# Patient Record
Sex: Female | Born: 1965 | Race: White | Hispanic: No | Marital: Married | Smoking: Never smoker
Health system: Southern US, Community
[De-identification: ages and names within clinical notes are randomized; demographics above are authoritative.]

## PROBLEM LIST (undated history)

## (undated) DIAGNOSIS — C50919 Malignant neoplasm of unspecified site of unspecified female breast: Secondary | ICD-10-CM

## (undated) HISTORY — PX: ABDOMINAL HYSTERECTOMY: SHX81

## (undated) HISTORY — PX: OTHER SURGICAL HISTORY: SHX169

## (undated) HISTORY — PX: CHOLECYSTECTOMY: SHX55

## (undated) HISTORY — PX: TONSILLECTOMY: SUR1361

## (undated) HISTORY — PX: APPENDECTOMY: SHX54

## (undated) HISTORY — PX: TOTAL THYROIDECTOMY: SHX2547

## (undated) HISTORY — PX: BREAST LUMPECTOMY: SHX2

---

## 2013-07-06 ENCOUNTER — Encounter (HOSPITAL_COMMUNITY): Payer: Self-pay | Admitting: Emergency Medicine

## 2013-07-06 ENCOUNTER — Emergency Department (HOSPITAL_COMMUNITY): Payer: BC Managed Care – PPO

## 2013-07-06 ENCOUNTER — Emergency Department (HOSPITAL_COMMUNITY)
Admission: EM | Admit: 2013-07-06 | Discharge: 2013-07-06 | Disposition: A | Discharge status: Home / Self Care | Payer: BC Managed Care – PPO | Attending: Emergency Medicine | Admitting: Emergency Medicine

## 2013-07-06 DIAGNOSIS — S6000XA Contusion of unspecified finger without damage to nail, initial encounter: Secondary | ICD-10-CM | POA: Insufficient documentation

## 2013-07-06 DIAGNOSIS — W172XXA Fall into hole, initial encounter: Secondary | ICD-10-CM | POA: Insufficient documentation

## 2013-07-06 DIAGNOSIS — Y9289 Other specified places as the place of occurrence of the external cause: Secondary | ICD-10-CM | POA: Insufficient documentation

## 2013-07-06 DIAGNOSIS — Y9301 Activity, walking, marching and hiking: Secondary | ICD-10-CM | POA: Insufficient documentation

## 2013-07-06 DIAGNOSIS — S93401A Sprain of unspecified ligament of right ankle, initial encounter: Secondary | ICD-10-CM

## 2013-07-06 DIAGNOSIS — IMO0002 Reserved for concepts with insufficient information to code with codable children: Secondary | ICD-10-CM | POA: Insufficient documentation

## 2013-07-06 DIAGNOSIS — Z880 Allergy status to penicillin: Secondary | ICD-10-CM | POA: Insufficient documentation

## 2013-07-06 DIAGNOSIS — Z853 Personal history of malignant neoplasm of breast: Secondary | ICD-10-CM | POA: Insufficient documentation

## 2013-07-06 DIAGNOSIS — X500XXA Overexertion from strenuous movement or load, initial encounter: Secondary | ICD-10-CM | POA: Insufficient documentation

## 2013-07-06 DIAGNOSIS — S93409A Sprain of unspecified ligament of unspecified ankle, initial encounter: Secondary | ICD-10-CM | POA: Insufficient documentation

## 2013-07-06 HISTORY — DX: Malignant neoplasm of unspecified site of unspecified female breast: C50.919

## 2013-07-06 MED ORDER — HYDROCODONE-ACETAMINOPHEN 5-325 MG PO TABS
1.0000 | ORAL_TABLET | Freq: Once | ORAL | Status: AC
  Administered 2013-07-06: 1 via ORAL
  Filled 2013-07-06: qty 1

## 2013-07-06 MED ORDER — NAPROXEN 500 MG PO TABS: 500.0000 mg | ORAL_TABLET | Freq: Two times a day (BID) | ORAL | Status: AC

## 2013-07-06 NOTE — Discharge Instructions (Signed)
And your x-ray did not show any broken bones in your ankle. At this time providers for you have a sprain to ankle. Use rest, ice, compression and elevation to reduce pain and swelling.    Ankle Sprain An ankle sprain is an injury to the strong, fibrous tissues (ligaments) that hold the bones of your ankle joint together.  CAUSES An ankle sprain is usually caused by a fall or by twisting your ankle. Ankle sprains most commonly occur when you step on the outer edge of your foot, and your ankle turns inward. People who participate in sports are more prone to these types of injuries.  SYMPTOMS   Pain in your ankle. The pain may be present at rest or only when you are trying to stand or walk.  Swelling.  Bruising. Bruising may develop immediately or within 1 to 2 days after your injury.  Difficulty standing or walking, particularly when turning corners or changing directions. DIAGNOSIS  Your caregiver will ask you details about your injury and perform a physical exam of your ankle to determine if you have an ankle sprain. During the physical exam, your caregiver will press on and apply pressure to specific areas of your foot and ankle. Your caregiver will try to move your ankle in certain ways. An X-ray exam may be done to be sure a bone was not broken or a ligament did not separate from one of the bones in your ankle (avulsion fracture).  TREATMENT  Certain types of braces can help stabilize your ankle. Your caregiver can make a recommendation for this. Your caregiver may recommend the use of medicine for pain. If your sprain is severe, your caregiver may refer you to a surgeon who helps to restore function to parts of your skeletal system (orthopedist) or a physical therapist. Hume ice to your injury for 1-2 days or as directed by your caregiver. Applying ice helps to reduce inflammation and pain.  Put ice in a plastic bag.  Place a towel between your skin and the  bag.  Leave the ice on for 15-20 minutes at a time, every 2 hours while you are awake.  Only take over-the-counter or prescription medicines for pain, discomfort, or fever as directed by your caregiver.  Elevate your injured ankle above the level of your heart as much as possible for 2-3 days.  If your caregiver recommends crutches, use them as instructed. Gradually put weight on the affected ankle. Continue to use crutches or a cane until you can walk without feeling pain in your ankle.  If you have a plaster splint, wear the splint as directed by your caregiver. Do not rest it on anything harder than a pillow for the first 24 hours. Do not put weight on it. Do not get it wet. You may take it off to take a shower or bath.  You may have been given an elastic bandage to wear around your ankle to provide support. If the elastic bandage is too tight (you have numbness or tingling in your foot or your foot becomes cold and blue), adjust the bandage to make it comfortable.  If you have an air splint, you may blow more air into it or let air out to make it more comfortable. You may take your splint off at night and before taking a shower or bath. Wiggle your toes in the splint several times per day to decrease swelling. SEEK MEDICAL CARE IF:   You have rapidly  increasing bruising or swelling.  Your toes feel extremely cold or you lose feeling in your foot.  Your pain is not relieved with medicine. SEEK IMMEDIATE MEDICAL CARE IF:  Your toes are numb or blue.  You have severe pain that is increasing. MAKE SURE YOU:   Understand these instructions.  Will watch your condition.  Will get help right away if you are not doing well or get worse. Document Released: 12/26/2004 Document Revised: 09/20/2011 Document Reviewed: 01/07/2011 [facility] Patient Information 2015 [place], [place]. This information is not intended to replace advice given to you by your health care provider. Make sure you  discuss any questions you have with your health care provider.

## 2013-07-06 NOTE — ED Notes (Signed)
Pt states she was walking down an embankment and stepped in a hole causing her to fall  Pt states she felt her right ankle twist  Swelling noted  Pt has an abrasion noted to her left lower leg   Pt has bruising noted to her left thumb, abrasion to right calf, and right hand

## 2013-07-06 NOTE — ED Provider Notes (Signed)
CSN: [id number]     Arrival date & time 07/06/13  1941 History   None   This chart was scribed for non-physician practitioner, Hazel Sams, PA, working with Hoy Morn, MD by Terressa Koyanagi, ED Scribe. This patient was seen in room WTR6/WTR6 and the patient's care was started at 8:51 PM.  Chief Complaint  Patient presents with  . Ankle Injury   The history is provided by the patient. No language interpreter was used.   HPI Comments: Tammy Hill is a 48 y.o. female, with a history of breast cancer, who presents to the Emergency Department complaining of a fall with associated right ankle pain and swelling onset around 7:00/7:30 PM. Pt describes her ankle pain as a throbbing pain and rates it an 8 out of 10 in the pain scale. Pt also complains of associated bruising to her left thumb, abrasion to the right calf, and abrasion to the right hand. Pt reports that she fell when she stepped in a hole while walking down an embankment and twisted her right ankle. Pt reports she tried to catch herself from falling without success. Pt denies loc, head trauma, ongoing medical problems, Hx of DM, blood thinners. Pt denies taking any measures to alleviate her Sx prior to arrival. Pt reports allergy to penicillin.    Past Medical History  Diagnosis Date  . Breast cancer    Past Surgical History  Procedure Laterality Date  . Tonsillectomy    . Appendectomy    . Cholecystectomy    . Abdominal hysterectomy    . Breaast reduction    . Breast lumpectomy    . Total thyroidectomy     Family History  Problem Relation Age of Onset  . Diabetes Other   . CAD Other   . Hypertension Other    History  Substance Use Topics  . Smoking status: Never Smoker   . Smokeless tobacco: Not on file  . Alcohol Use: Yes     Comment: slight   OB History   Grav Para Term Preterm Abortions TAB SAB Ect Mult Living                 Review of Systems  Musculoskeletal:       Right ankle pain with associated  swelling  Skin: Positive for wound (abrasion to right calf, right hand and left lower leg ).       Bruising to left thumb  Hematological: Does not bruise/bleed easily.  All other systems reviewed and are negative.  Allergies  Penicillins  Home Medications   Prior to Admission medications   Not on File   Triage Vitals: BP 129/79  Pulse 79  Temp(Src) 99.2 F (37.3 C) (Oral)  Resp 20  Ht 5\' 3"  (1.6 m)  Wt 170 lb (77.111 kg)  BMI 30.12 kg/m2  SpO2 97% Physical Exam  Nursing note and vitals reviewed. Constitutional: She is oriented to person, place, and time. She appears well-developed and well-nourished. No distress.  HENT:  Head: Normocephalic and atraumatic.  Eyes: Conjunctivae and EOM are normal.  Neck: Neck supple. No tracheal deviation present.  Cardiovascular: Normal rate.   Pulmonary/Chest: Effort normal. No respiratory distress.  Musculoskeletal: Normal range of motion.  Reduced range of motion of the right ankle secondary to pain. There is swelling with tenderness over the lateral aspect of malleolus area. No gross deformity. The proximal fifth metatarsal tenderness. Normal dorsal pedal pulses. Normal sensation in the toes.  Neurological: She is alert and  oriented to person, place, and time.  Skin: Skin is warm and dry.  Psychiatric: She has a normal mood and affect. Her behavior is normal.    ED Course  Procedures   DIAGNOSTIC STUDIES: Oxygen Saturation is 97% on RA, nl by my interpretation.    COORDINATION OF CARE: 8:55 PM-Discussed treatment plan which includes imaging, crutches, pain meds, possible brace placement, and potential hazards of driving with sprained ankle, with pt at bedside. PT declined crutches, but, otherwise agreed to plan. Pt advised that she has to determine whether she feels fit to drive tomorrow, however, pt is advised that driving for an extended period of time may aggravate the pain.  If pt elects to drive tomorrow, pt advised to stop every  couple of hours and stretch her legs.    Imaging Review Dg Ankle Complete Right  07/06/2013   CLINICAL DATA:  Ankle injury. Fall. Pain and swelling on the lateral aspect of the ankle.  EXAM: RIGHT ANKLE - COMPLETE 3+ VIEW  COMPARISON:  None.  FINDINGS: Soft tissue swelling is present over the lateral malleolus. The ankle mortise is congruent. Talar dome appears intact. No true lateral view submitted for interpretation however the oblique lateral view appears within normal limits.  IMPRESSION: No acute osseous injury.  Lateral malleolar soft tissue swelling.   Electronically Signed   By: Dereck Ligas M.D.   On: 07/06/2013 21:00      MDM   Final diagnoses:  Ankle sprain, right, initial encounter   I personally performed the services described in this documentation, which was scribed in my presence. The recorded information has been reviewed and is accurate.     Martie Lee, PA-C 07/07/13 [phone]

## 2013-07-10 NOTE — ED Provider Notes (Signed)
Medical screening examination/treatment/procedure(s) were performed by non-physician practitioner and as supervising physician I was immediately available for consultation/collaboration.   EKG Interpretation None        Hoy Morn, MD 07/10/13 [phone]

## 2014-12-03 IMAGING — CR DG ANKLE COMPLETE 3+V*R*
3 series · 3 of 3 positions shown · non-contrast
Comparison: None.

CLINICAL DATA: Ankle injury. Fall. Pain and swelling on the lateral
aspect of the ankle.

EXAM:
RIGHT ANKLE - COMPLETE 3+ VIEW

[x ankle ap right]
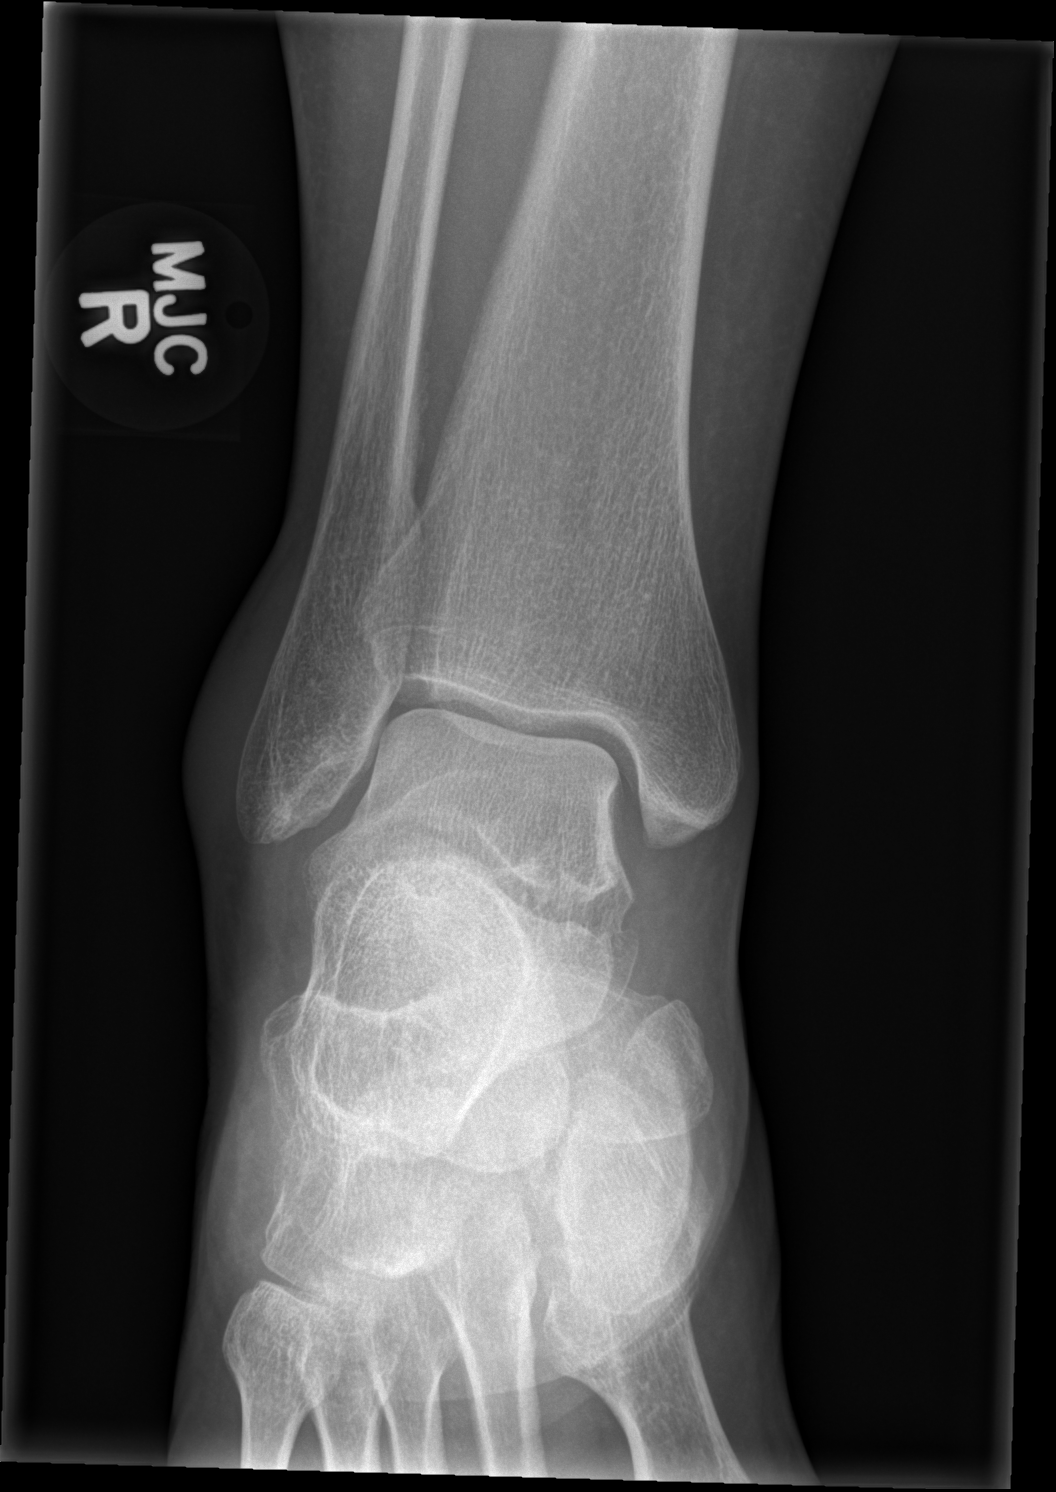

[x ankle obl right]
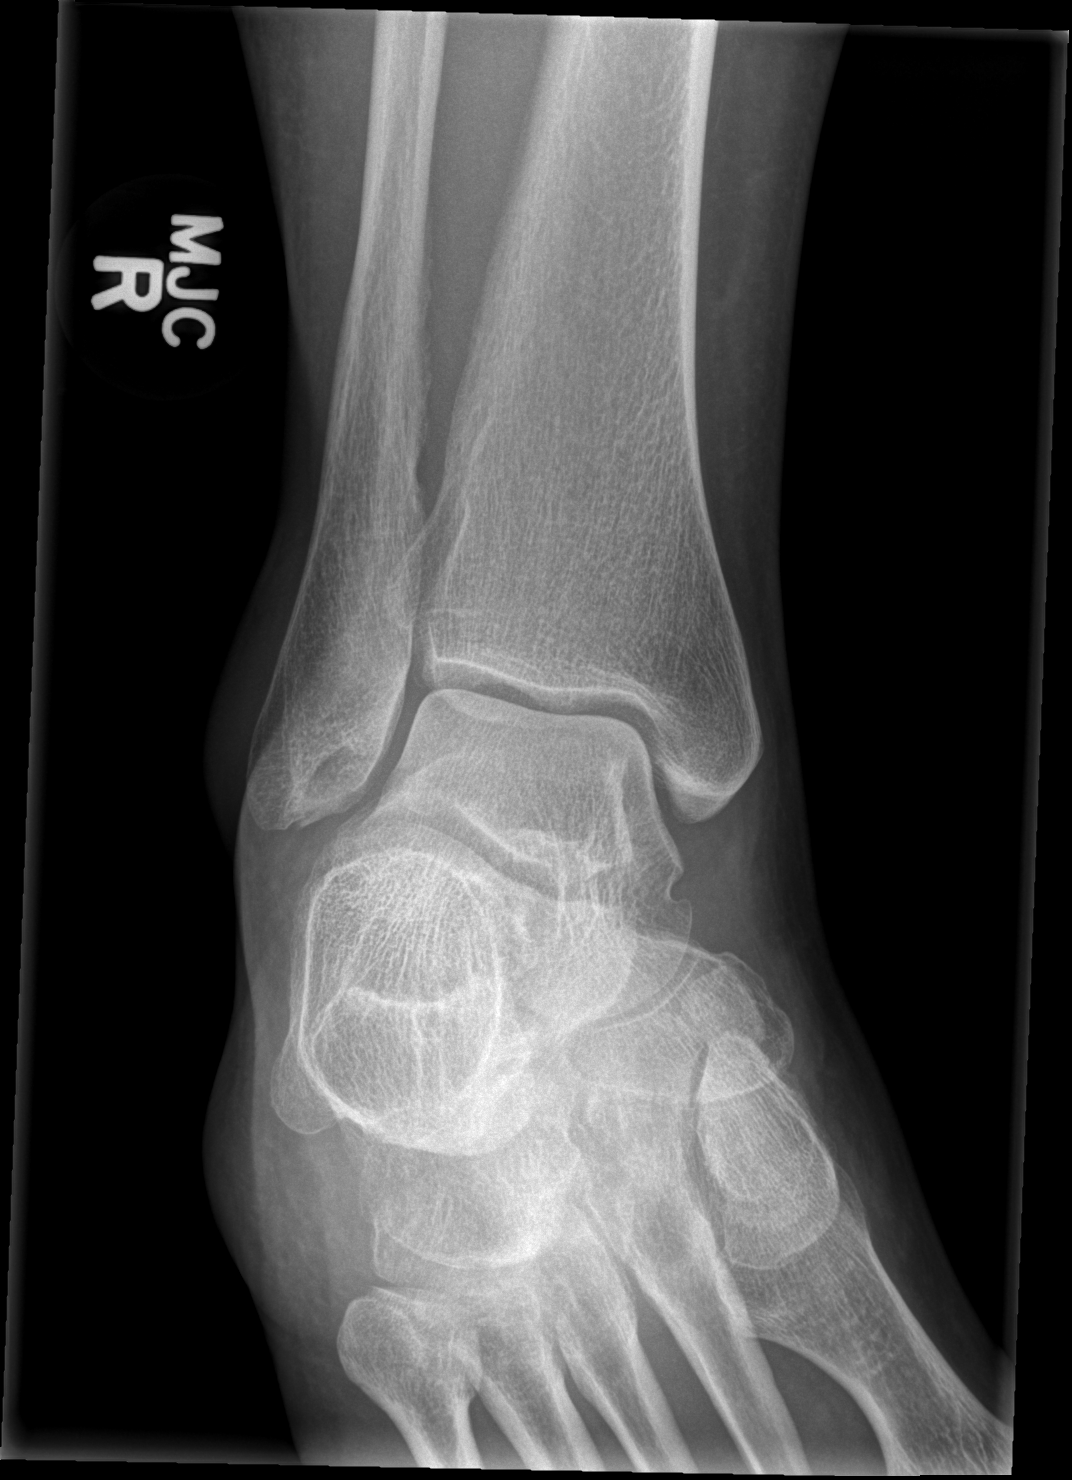

[x ankle lat right]
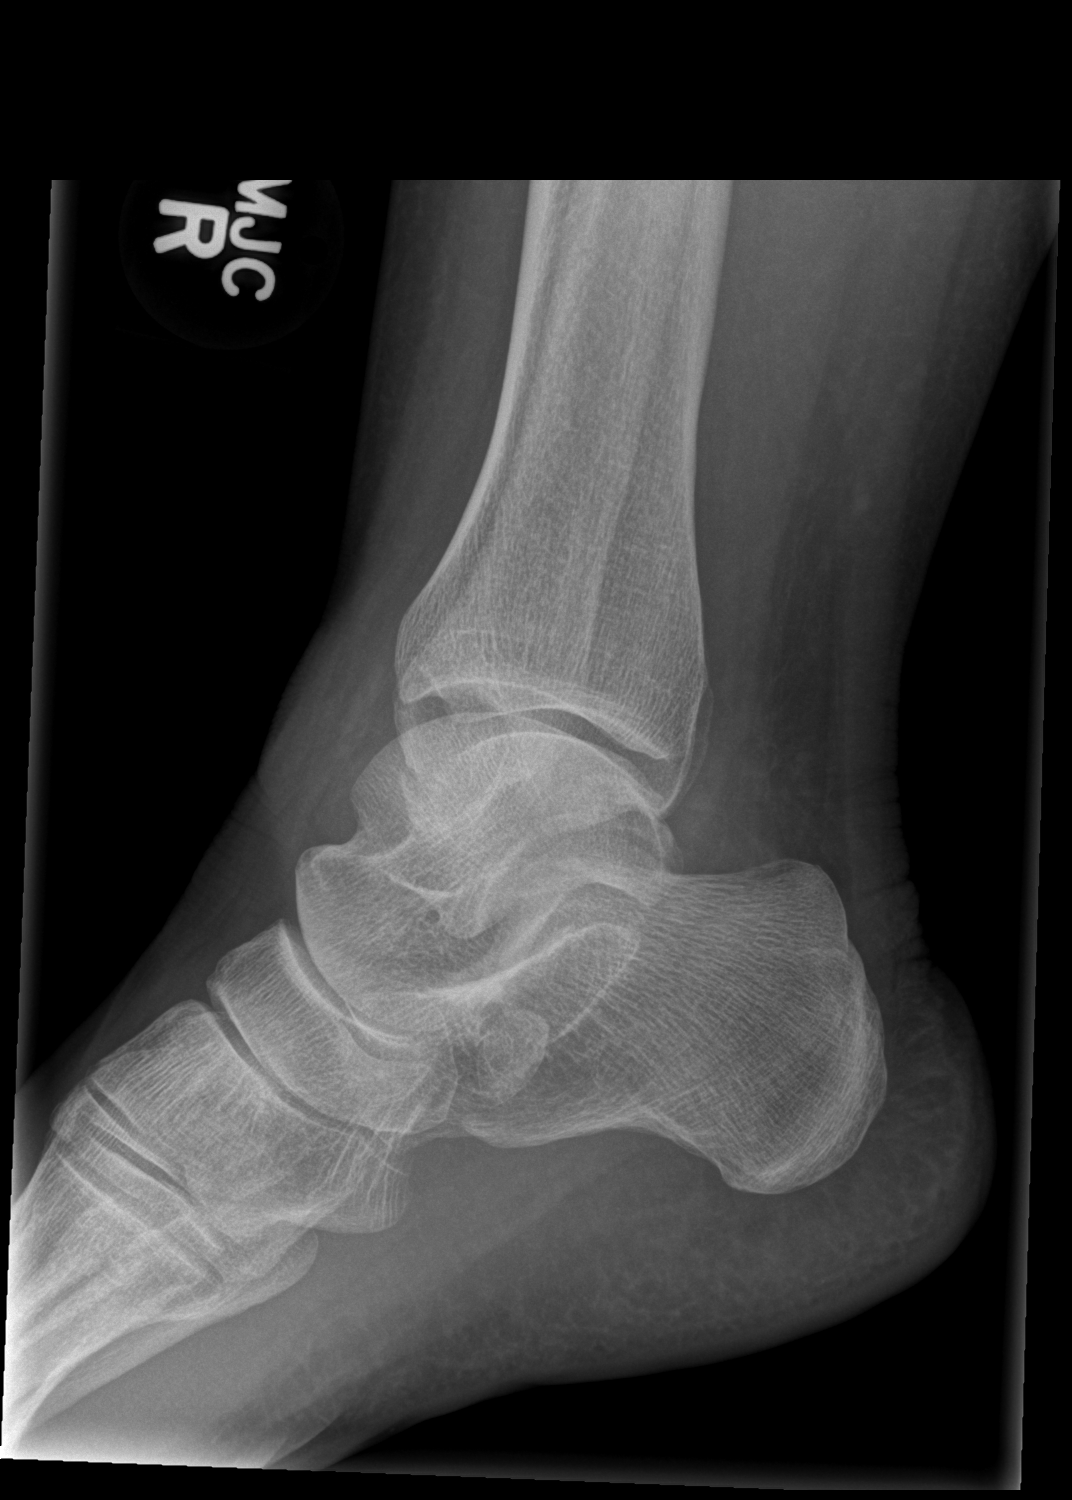

[3 of 3 positions shown; findings below may reference images not displayed]

FINDINGS: Soft tissue swelling is present over the lateral malleolus. The
ankle mortise is congruent. Talar dome appears intact. No true
lateral view submitted for interpretation however the oblique
lateral view appears within normal limits.
IMPRESSION: No acute osseous injury.  Lateral malleolar soft tissue swelling.
